# Patient Record
Sex: Male | Born: 1980 | Race: Black or African American | Hispanic: No | Marital: Single | Smoking: Never smoker
Health system: Southern US, Community
[De-identification: ages and names within clinical notes are randomized; demographics above are authoritative.]

## PROBLEM LIST (undated history)

## (undated) DIAGNOSIS — I10 Essential (primary) hypertension: Secondary | ICD-10-CM

---

## 2018-02-10 ENCOUNTER — Other Ambulatory Visit: Payer: Self-pay

## 2018-02-10 ENCOUNTER — Encounter: Payer: Self-pay | Admitting: Emergency Medicine

## 2018-02-10 ENCOUNTER — Emergency Department
Admission: EM | Admit: 2018-02-10 | Discharge: 2018-02-10 | Disposition: A | Discharge status: Home / Self Care | Payer: Self-pay | Attending: Student in an Organized Health Care Education/Training Program | Admitting: Student in an Organized Health Care Education/Training Program

## 2018-02-10 DIAGNOSIS — I10 Essential (primary) hypertension: Secondary | ICD-10-CM | POA: Insufficient documentation

## 2018-02-10 DIAGNOSIS — K0889 Other specified disorders of teeth and supporting structures: Secondary | ICD-10-CM | POA: Insufficient documentation

## 2018-02-10 HISTORY — DX: Essential (primary) hypertension: I10

## 2018-02-10 MED ORDER — IBUPROFEN 800 MG PO TABS: 800.0000 mg | ORAL_TABLET | Freq: Three times a day (TID) | ORAL | 0 refills | Status: AC | PRN

## 2018-02-10 MED ORDER — TRAMADOL HCL 50 MG PO TABS: 50.0000 mg | ORAL_TABLET | Freq: Four times a day (QID) | ORAL | 0 refills | Status: AC | PRN

## 2018-02-10 MED ORDER — AMOXICILLIN 500 MG PO CAPS: 500.0000 mg | ORAL_CAPSULE | Freq: Three times a day (TID) | ORAL | 0 refills | Status: AC

## 2018-02-10 MED ORDER — KETOROLAC TROMETHAMINE 30 MG/ML IJ SOLN
30.0000 mg | Freq: Once | INTRAMUSCULAR | Status: AC
  Administered 2018-02-10: 30 mg via INTRAMUSCULAR
  Filled 2018-02-10: qty 1

## 2018-02-10 NOTE — ED Notes (Signed)
See triage note  Presents with dental pain  Was recently placed on antibiotics but now having increased pain

## 2018-02-10 NOTE — ED Triage Notes (Signed)
Right uppper tooth needs to be pulled and has appt for that.  Dentist put him on antibiotics and he is almost done with them, but pain is increaseing.

## 2018-02-10 NOTE — ED Provider Notes (Signed)
[facility] Emergency Department Provider Note  ____________________________________________  Time seen: Approximately 3:45 PM  I have reviewed the triage vital signs and the nursing notes.   HISTORY  Chief Complaint Dental Pain and Otalgia    HPI Luis Reynolds is a 37 y.o. male presents to the emergency department with dental pain from superior four.  Patient reports that he has been under the care of a dentist.  Patient anticipated having superior 4 pulled tomorrow but oral surgeon had to reschedule.  Patient reports that pain has been unmanageable and he is having difficulty sleeping.  He currently rates his pain at 10 out of 10 in intensity.  Patient has a history of essential hypertension and reports that blood pressure has been on managed recently due to pain.  Past Medical History:  Diagnosis Date  . Hypertension     There are no active problems to display for this patient.   History reviewed. No pertinent surgical history.  Prior to Admission medications   Medication Sig Start Date End Date Taking? Authorizing Provider  amoxicillin (AMOXIL) 500 MG capsule Take 1 capsule (500 mg total) by mouth 3 (three) times daily for 10 days. 02/10/18 02/20/18  Orvil Feil, PA-C  ibuprofen (ADVIL,MOTRIN) 800 MG tablet Take 1 tablet (800 mg total) by mouth every 8 (eight) hours as needed for up to 5 days. 02/10/18 02/15/18  Orvil Feil, PA-C  traMADol (ULTRAM) 50 MG tablet Take 1 tablet (50 mg total) by mouth every 6 (six) hours as needed for up to 3 days. 02/10/18 02/13/18  Orvil Feil, PA-C    Allergies Patient has no known allergies.  No family history on file.  Social History Social History   Tobacco Use  . Smoking status: Never Smoker  . Smokeless tobacco: Never Used  Substance Use Topics  . Alcohol use: Never    Frequency: Never  . Drug use: Not on file     Review of Systems  Constitutional: No fever/chills Eyes: No visual changes. No  discharge ENT: Patient has dental pain.  Cardiovascular: no chest pain. Respiratory: no cough. No SOB. Gastrointestinal: No abdominal pain.  No nausea, no vomiting.  No diarrhea.  No constipation. Musculoskeletal: Negative for musculoskeletal pain. Skin: Negative for rash, abrasions, lacerations, ecchymosis. Neurological: Negative for headaches, focal weakness or numbness.   ____________________________________________   PHYSICAL EXAM:  VITAL SIGNS: ED Triage Vitals [02/10/18 1501]  Enc Vitals Group     BP (!) 201/103     Pulse Rate (!) 114     Resp 16     Temp 99.5 F (37.5 C)     Temp Source Oral     SpO2 97 %     Weight 255 lb (115.7 kg)     Height  (1.702 m)     Head Circumference      Peak Flow      Pain Score 10     Pain Loc      Pain Edu?      Excl. in GC?      Constitutional: Alert and oriented. Well appearing and in no acute distress. Eyes: Conjunctivae are normal. PERRL. EOMI. Head: Atraumatic. ENT:      Ears: TMs are pearly.       Nose: No congestion/rhinnorhea.      Mouth/Throat: Mucous membranes are moist.  No dental caries.  [facility] tenderness to palpation. Cardiovascular: Normal rate, regular  rhythm. Normal S1 and S2.  Good peripheral circulation. Respiratory: Normal respiratory effort without tachypnea or retractions. Lungs CTAB. Good air entry to the bases with no decreased or absent breath sounds.  Musculoskeletal: Full range of motion to all extremities. No gross deformities appreciated. Neurologic:  Normal speech and language. No gross focal neurologic deficits are appreciated.  Skin:  Skin is warm, dry and intact. No rash noted.  ____________________________________________   LABS (all labs ordered are listed, but only abnormal results are displayed)  Labs Reviewed - No data to  display ____________________________________________  EKG   ____________________________________________  RADIOLOGY   No results found.  ____________________________________________    PROCEDURES  Procedure(s) performed:    Procedures    Medications - No data to display   ____________________________________________   INITIAL IMPRESSION / ASSESSMENT AND PLAN / ED COURSE  Pertinent labs & imaging results that were available during my care of the patient were reviewed by me and considered in my medical decision making (see chart for details).  Review of the [place] CSRS was performed in accordance of the [facility] prior to dispensing any controlled drugs.     Assessment and plan Dental pain Patient presents to the emergency department with superior for pain.  Overall physical exam was reassuring.  Patient was discharged with amoxicillin, a short course of tramadol and ibuprofen 800s.  He received an injection of Toradol in the emergency department. Blood pressure was reassessed prior to discharge at 187/93.   ____________________________________________  FINAL CLINICAL IMPRESSION(S) / ED DIAGNOSES  Final diagnoses:  Pain, dental      NEW MEDICATIONS STARTED DURING THIS VISIT:  ED Discharge Orders        Ordered    amoxicillin (AMOXIL) 500 MG capsule  3 times daily     02/10/18 1542    traMADol (ULTRAM) 50 MG tablet  Every 6 hours PRN     02/10/18 1542    ibuprofen (ADVIL,MOTRIN) 800 MG tablet  Every 8 hours PRN     02/10/18 1542          This chart was dictated using voice recognition software/Dragon. Despite best efforts to proofread, errors can occur which can change the meaning. Any change was purely unintentional.    Orvil Feil, PA-C 02/10/18 1555    Willy Eddy, MD 02/10/18 1757

## 2018-02-10 NOTE — Discharge Instructions (Signed)
OPTIONS FOR DENTAL FOLLOW UP CARE ° °[place] Department of Health and Human Services - Local Safety Net Dental Clinics °http://www.ncdhhs.gov/dph/oralhealth/services/safetynetclinics.htm °  °[facility] (336-562-3123) ° °[facility] [place] ([phone]) ° °[facility] ([phone] ext [phone]) ° °[facility] ([phone]) ° °[facility] ([phone]) °This clinic caters to the indigent population and is on a lottery system. °Location: °[facility], [address], [place] °Clinic Hours: °Wednesdays from 6pm - 9pm, patients seen by a lottery system. °For dates, call or go to [email or web address].[facility].edu/[facility]/patients/Dental-[facility] °Services: °Cleanings, fillings and simple extractions. °Payment Options: °DENTAL WORK IS FREE OF CHARGE. Bring proof of income or support. °Best way to get seen: °Arrive at 5:15 pm - this is a lottery, NOT first come/first serve, so arriving earlier will not increase your chances of being seen. °  °  °[facility] °[phone] °Select option 1 for emergencies °  °Location: °[facility], [address], [place] °Clinic Hours: °No walk-ins accepted - call the day before to schedule an appointment. °Check in times are 9:30 am and 1:30 pm. °Services: °Simple extractions, temporary fillings, pulpectomy/pulp debridement, uncomplicated abscess drainage. °Payment Options: °PAYMENT IS DUE AT THE TIME OF SERVICE.  Fee is usually $100-200, additional surgical procedures (e.g. abscess drainage) may be extra. °Cash, checks, Visa/MasterCard accepted.  Can file Medicaid if patient is covered for dental - patient should call case worker to check. °No discount for [facility] patients. °Best way to get seen: °MUST call the day before and get onto the schedule. Can usually be seen the next 1-2 days. No walk-ins accepted. °  °  °[facility] °[phone] °   °Location: °[facility], [address], [place] °Clinic Hours: °M, W, Th, F 8am or 1:30pm, Tues 9a or 1:30 - first come/first served. °Services: °Simple extractions, temporary fillings, uncomplicated abscess drainage.  You do not need to be an [place] resident. °Payment Options: °PAYMENT IS DUE AT THE TIME OF SERVICE. °Dental insurance, otherwise sliding scale - bring proof of income or support. °Depending on income and treatment needed, cost is usually $50-200. °Best way to get seen: °Arrive early as it is first come/first served. °  °  °[facility] °[phone] °  °Location: °[address] °Clinic Hours: °Mon-Thu 8a-5p °Services: °Most basic dental services including extractions and fillings. °Payment Options: °PAYMENT IS DUE AT THE TIME OF SERVICE. °Sliding scale, up to 50% off - bring proof if income or support. °Medicaid with dental option accepted. °Best way to get seen: °Call to schedule an appointment, can usually be seen within 2 weeks OR they will try to see walk-ins - show up at 8a or 2p (you may have to wait). °  °  °[facility] °[phone] °[place] RESIDENTS ONLY °  °Location: °[facility], [address], [place], [place] [address] °Clinic Hours: By appointment only. °Monday - Thursday 8am-5pm, Friday 8am-12pm °Services: Cleanings, fillings, extractions. °Payment Options: °PAYMENT IS DUE AT THE TIME OF SERVICE. °Cash, Visa or MasterCard. Sliding scale - $30 minimum per service. °Best way to get seen: °Come in to office, complete packet and make an appointment - need proof of income °or support monies for each household member and proof of [place] residence. °Usually takes about a month to get in. °  °  °[facility] °[phone] °  °Location: °[address]   [place] °Clinic Hours: Walk-in Urgent Care Dental Services are offered Monday-Friday  mornings only. °The numbers of emergencies accepted daily is limited to the number of °providers available. °Maximum 15 - Mondays, Wednesdays & Thursdays °Maximum 10 - Tuesdays & Fridays °Services: °You do not need to be a [place] resident to be seen for a dental emergency. °Emergencies are defined as pain, swelling, abnormal bleeding, or dental trauma. Walkins will receive x-rays if needed. °NOTE: Dental cleaning is not an emergency. °Payment Options: °PAYMENT IS DUE AT THE TIME OF SERVICE. °Minimum co-pay is $40.00 for uninsured patients. °Minimum co-pay is $3.00 for Medicaid with dental coverage. °Dental Insurance is accepted and must be presented at time of visit. °Medicare does not cover dental. °Forms of payment: Cash, credit card, checks. °Best way to get seen: °If not previously registered with the clinic, walk-in dental registration begins at 7:15 am and is on a first come/first serve basis. °If previously registered with the clinic, call to make an appointment. °  °  °[facility] °[phone] °[place] RESIDENTS ONLY °  °Location: °[address], [place], [place] °Clinic Hours: °Mon-Thu 10a-2p °Services: Extractions only! °Payment Options: °FREE (donations accepted) - bring proof of income or support °Best way to get seen: °Call and schedule an appointment OR come at 8am on the 1st Monday of every month (except for holidays) when it is first come/first served. °  °  °[facility] °[phone] °  °Location: °[address], [place] °Clinic Hours: °Friday mornings °Services, Payment Options, Best way to get seen: °Call for info °

## 2018-04-03 ENCOUNTER — Encounter: Payer: Self-pay | Admitting: Emergency Medicine

## 2018-04-03 ENCOUNTER — Emergency Department
Admission: EM | Admit: 2018-04-03 | Discharge: 2018-04-03 | Disposition: A | Discharge status: Home / Self Care | Payer: Self-pay | Attending: Emergency Medicine | Admitting: Emergency Medicine

## 2018-04-03 DIAGNOSIS — M5441 Lumbago with sciatica, right side: Secondary | ICD-10-CM | POA: Insufficient documentation

## 2018-04-03 DIAGNOSIS — I10 Essential (primary) hypertension: Secondary | ICD-10-CM | POA: Insufficient documentation

## 2018-04-03 MED ORDER — METHOCARBAMOL 500 MG PO TABS: 500.0000 mg | ORAL_TABLET | Freq: Three times a day (TID) | ORAL | 0 refills | Status: AC | PRN

## 2018-04-03 MED ORDER — KETOROLAC TROMETHAMINE 10 MG PO TABS: 10.0000 mg | ORAL_TABLET | Freq: Four times a day (QID) | ORAL | 0 refills | Status: AC | PRN

## 2018-04-03 MED ORDER — KETOROLAC TROMETHAMINE 30 MG/ML IJ SOLN
30.0000 mg | Freq: Once | INTRAMUSCULAR | Status: AC
  Administered 2018-04-03: 30 mg via INTRAMUSCULAR
  Filled 2018-04-03: qty 1

## 2018-04-03 NOTE — ED Provider Notes (Signed)
[facility] Emergency Department Provider Note  ____________________________________________  Time seen: Approximately 8:56 PM  I have reviewed the triage vital signs and the nursing notes.   HISTORY  Chief Complaint Leg Pain    HPI Luis Reynolds is a 37 y.o. male   presents to the emergency department with low back pain with right lower extremity radiculopathy that started this morning.  Patient describes radiating pain as "like electricity and spasming".  Pain is worsened with ambulation and improved with rest.  Patient denies falls or mechanisms of trauma.  He has not taken any medications today for pain.  He denies bowel or bladder incontinence as well as saddle anesthesia.   Past Medical History:  Diagnosis Date  . Hypertension     There are no active problems to display for this patient.   History reviewed. No pertinent surgical history.  Prior to Admission medications   Medication Sig Start Date End Date Taking? Authorizing Provider  ketorolac (TORADOL) 10 MG tablet Take 1 tablet (10 mg total) by mouth every 6 (six) hours as needed for up to 5 days. 04/03/18 04/08/18  Orvil Feil, PA-C  methocarbamol (ROBAXIN) 500 MG tablet Take 1 tablet (500 mg total) by mouth every 8 (eight) hours as needed for up to 5 days for muscle spasms. 04/03/18 04/08/18  Orvil Feil, PA-C    Allergies Patient has no known allergies.  No family history on file.  Social History Social History   Tobacco Use  . Smoking status: Never Smoker  . Smokeless tobacco: Never Used  Substance Use Topics  . Alcohol use: Never    Frequency: Never  . Drug use: Not on file     Review of Systems  Constitutional: No fever/chills Eyes: No visual changes. No discharge ENT: No upper respiratory complaints. Cardiovascular: no chest pain. Respiratory: no cough. No SOB. Gastrointestinal: No abdominal pain.  No nausea, no vomiting.  No diarrhea.  No  constipation. Genitourinary: Negative for dysuria. No hematuria Musculoskeletal: Patient has low back pain. Skin: Negative for rash, abrasions, lacerations, ecchymosis. Neurological: Negative for headaches, focal weakness or numbness.   ____________________________________________   PHYSICAL EXAM:  VITAL SIGNS: ED Triage Vitals  Enc Vitals Group     BP 04/03/18 2011 (!) 144/99     Pulse Rate 04/03/18 2011 (!) 120     Resp 04/03/18 2011 18     Temp 04/03/18 2011 98.7 F (37.1 C)     Temp src --      SpO2 04/03/18 2011 95 %     Weight 04/03/18 2012 245 lb (111.1 kg)     Height 04/03/18 2012 5\' 7"  (1.702 m)     Head Circumference --      Peak Flow --      Pain Score 04/03/18 2011 8     Pain Loc --      Pain Edu? --      Excl. in GC? --      Constitutional: Alert and oriented. Well appearing and in no acute distress. Eyes: Conjunctivae are normal. PERRL. EOMI. Head: Atraumatic. Cardiovascular: Normal rate, regular rhythm. Normal S1 and S2.  Good peripheral circulation. Respiratory: Normal respiratory effort without tachypnea or retractions. Lungs CTAB. Good air entry to the bases with no decreased or absent breath sounds. Gastrointestinal: Bowel sounds 4 quadrants. Soft and nontender to palpation. No guarding or rigidity. No palpable masses. No distention. No CVA tenderness. Musculoskeletal: Full range of motion to all extremities. No gross deformities  appreciated.  Patient has paraspinal muscle tenderness elicited on physical exam and a positive straight leg raise, right. Neurologic:  Normal speech and language. No gross focal neurologic deficits are appreciated.  Skin:  Skin is warm, dry and intact. No rash noted. Psychiatric: Mood and affect are normal. Speech and behavior are normal. Patient exhibits appropriate insight and judgement.   ____________________________________________   LABS (all labs ordered are listed, but only abnormal results are displayed)  Labs  Reviewed - No data to display ____________________________________________  EKG   ____________________________________________  RADIOLOGY   No results found.  ____________________________________________    PROCEDURES  Procedure(s) performed:    Procedures    Medications  ketorolac (TORADOL) 30 MG/ML injection 30 mg (30 mg Intramuscular Given 04/03/18 2056)     ____________________________________________   INITIAL IMPRESSION / ASSESSMENT AND PLAN / ED COURSE  Pertinent labs & imaging results that were available during my care of the patient were reviewed by me and considered in my medical decision making (see chart for details).  Review of the [place] CSRS was performed in accordance of the [facility] prior to dispensing any controlled drugs.      Assessment and plan Low back pain with right lower extremity radiculopathy Patient presents to the emergency department with low back pain  with radiculopathy of the right lower extremity that started today.  Patient was given an injection of Toradol in the emergency department and discharged with Toradol by mouth.  He was also discharged with a short course of Robaxin.  He was advised to follow-up with primary care as needed.  All patient questions were answered.    ____________________________________________  FINAL CLINICAL IMPRESSION(S) / ED DIAGNOSES  Final diagnoses:  Acute right-sided low back pain with right-sided sciatica      NEW MEDICATIONS STARTED DURING THIS VISIT:  ED Discharge Orders        Ordered    ketorolac (TORADOL) 10 MG tablet  Every 6 hours PRN     04/03/18 2052    methocarbamol (ROBAXIN) 500 MG tablet  Every 8 hours PRN     04/03/18 2052          This chart was dictated using voice recognition software/Dragon. Despite best efforts to proofread, errors can occur which can change the meaning. Any change was purely unintentional.    Orvil FeilWoods, Marai Teehan M, PA-C 04/03/18 2059    Sharman CheekStafford,  Phillip, MD 04/03/18 2248

## 2018-04-03 NOTE — ED Triage Notes (Signed)
Patient with complaint of lower back pain radiating down to his right knee that started this morning. Patient denies any injury.

## 2018-04-25 ENCOUNTER — Emergency Department: Payer: Self-pay

## 2018-04-25 ENCOUNTER — Emergency Department
Admission: EM | Admit: 2018-04-25 | Discharge: 2018-04-25 | Disposition: A | Discharge status: Home / Self Care | Payer: Self-pay | Attending: Emergency Medicine | Admitting: Emergency Medicine

## 2018-04-25 ENCOUNTER — Encounter: Payer: Self-pay | Admitting: Emergency Medicine

## 2018-04-25 ENCOUNTER — Other Ambulatory Visit: Payer: Self-pay

## 2018-04-25 DIAGNOSIS — M25561 Pain in right knee: Secondary | ICD-10-CM | POA: Insufficient documentation

## 2018-04-25 DIAGNOSIS — R2241 Localized swelling, mass and lump, right lower limb: Secondary | ICD-10-CM | POA: Insufficient documentation

## 2018-04-25 DIAGNOSIS — I10 Essential (primary) hypertension: Secondary | ICD-10-CM | POA: Insufficient documentation

## 2018-04-25 DIAGNOSIS — R079 Chest pain, unspecified: Secondary | ICD-10-CM | POA: Insufficient documentation

## 2018-04-25 LAB — BASIC METABOLIC PANEL
ANION GAP: 7 (ref 5–15)
BUN: 12 mg/dL (ref 6–20)
CALCIUM: 9.2 mg/dL (ref 8.9–10.3)
CHLORIDE: 105 mmol/L (ref 98–111)
CO2: 28 mmol/L (ref 22–32)
Creatinine, Ser: 0.8 mg/dL (ref 0.61–1.24)
Glucose, Bld: 106 mg/dL — ABNORMAL HIGH (ref 70–99)
Potassium: 3.6 mmol/L (ref 3.5–5.1)
Sodium: 140 mmol/L (ref 135–145)

## 2018-04-25 LAB — CBC
HEMATOCRIT: 42.6 % (ref 40.0–52.0)
HEMOGLOBIN: 14.8 g/dL (ref 13.0–18.0)
MCH: 31.4 pg (ref 26.0–34.0)
MCHC: 34.7 g/dL (ref 32.0–36.0)
MCV: 90.7 fL (ref 80.0–100.0)
Platelets: 196 10*3/uL (ref 150–440)
RBC: 4.7 MIL/uL (ref 4.40–5.90)
RDW: 13.2 % (ref 11.5–14.5)
WBC: 6.8 10*3/uL (ref 3.8–10.6)

## 2018-04-25 LAB — TROPONIN I: Troponin I: <0.03 ng/mL (ref <0.03)

## 2018-04-25 LAB — FIBRIN DERIVATIVES D-DIMER (ARMC ONLY): FIBRIN DERIVATIVES D-DIMER (ARMC): 330.32 ng{FEU}/mL (ref 0.00–499.00)

## 2018-04-25 MED ORDER — ACETAMINOPHEN 500 MG PO TABS
1000.0000 mg | ORAL_TABLET | ORAL | Status: AC
  Administered 2018-04-25: 1000 mg via ORAL
  Filled 2018-04-25: qty 2

## 2018-04-25 MED ORDER — ASPIRIN 81 MG PO CHEW
324.0000 mg | CHEWABLE_TABLET | Freq: Once | ORAL | Status: AC
  Administered 2018-04-25: 324 mg via ORAL
  Filled 2018-04-25: qty 4

## 2018-04-25 NOTE — ED Triage Notes (Signed)
Pt to ed with c/o right leg pain that starts in knee and around the back of the leg x 1 month.  Pt also states he had central chest pain this am intermittent, denies sob, denies dizziness, denies weakness, denies n/v, denies back pain, denies diaphoresis.

## 2018-04-25 NOTE — ED Notes (Signed)
Patient transported to Ultrasound 

## 2018-04-25 NOTE — ED Notes (Signed)
First Nurse Note: Patient taken to Triage 3, EKG done and reviewed by Dr. Roxan Hockeyobinson.  Skin warm and dry, color good.

## 2018-04-25 NOTE — ED Notes (Signed)
Pt c/o right leg pain for the past month off and on - last night the pain got worse and has increased in intensity - pain is worse with walking and standing Pt c/o chest pain that started this am - the pain is on the right side and radiates to the middle - the pain is a sharp pain that comes and goes

## 2018-04-25 NOTE — ED Provider Notes (Signed)
[facility] Emergency Department Provider Note   ____________________________________________   First MD Initiated Contact with Patient 04/25/18 1030     (approximate)  I have reviewed the triage vital signs and the nursing notes.   HISTORY  Chief Complaint Chest Pain and Leg Pain    HPI Luis Reynolds is a 37 y.o. male expressing pain behind his right knee for about a month.  Been told is likely muscular or could have injured something inside the joint, has not been able to follow-up with orthopedic.  Reports he still able to walk on it, causes achy pain mostly when he standing or bending the right knee.  Started about a month ago when he felt something "pop" when he was getting up out of bed.  Describes a moderate achy right knee discomfort.  No swelling in the leg.  Also had a brief episode where he felt achiness in his chest this morning when the knee was hurting.  This is now resolved after taking Toradol.  Reports it was a slight sharp feeling in the middle of his chest but is now gone away.  No shortness of breath.  Able to exert himself without difficulty except for discomfort in the right knee.  No personal history of heart disease but does have a history of "high blood pressure".  Takes no medication of than occasional Toradol or ibuprofen   Past Medical History:  Diagnosis Date  . Hypertension     There are no active problems to display for this patient.   History reviewed. No pertinent surgical history.  Prior to Admission medications   Not on File    Allergies Patient has no known allergies.  History reviewed. No pertinent family history.  Social History Social History   Tobacco Use  . Smoking status: Never Smoker  . Smokeless tobacco: Never Used  Substance Use Topics  . Alcohol use: Never    Frequency: Never  . Drug use: Never    Review of Systems Constitutional: No fever/chills Eyes: No visual changes. ENT: No sore  throat. Cardiovascular: See HPI. Respiratory: Denies shortness of breath. Gastrointestinal: No abdominal pain.  No nausea, no vomiting.  No diarrhea.  No constipation. Genitourinary: Negative for dysuria. Musculoskeletal: Negative for back pain.  Denies any discomfort or problems involving the left leg or arms. Skin: Negative for rash. Neurological: Negative for headaches, focal weakness or numbness.  No weakness in the right foot.  No numbness or tingling.    ____________________________________________   PHYSICAL EXAM:  VITAL SIGNS: ED Triage Vitals  Enc Vitals Group     BP 04/25/18 1019 (!) 159/82     Pulse Rate 04/25/18 1019 (!) 108     Resp 04/25/18 1019 16     Temp 04/25/18 1019 98.7 F (37.1 C)     Temp Source 04/25/18 1019 Oral     SpO2 04/25/18 1019 100 %     Weight 04/25/18 1020 245 lb (111.1 kg)     Height 04/25/18 1020 5\' 7"  (1.702 m)     Head Circumference --      Peak Flow --      Pain Score 04/25/18 1019 10     Pain Loc --      Pain Edu? --      Excl. in GC? --     Constitutional: Alert and oriented. Well appearing and in no acute distress.  Very pleasant. Eyes: Conjunctivae are normal. Head: Atraumatic. Nose: No congestion/rhinnorhea. Mouth/Throat: Mucous membranes are  moist. Neck: No stridor.   Cardiovascular: Normal rate, regular rhythm. Grossly normal heart sounds.  Good peripheral circulation. Respiratory: Normal respiratory effort.  No retractions. Lungs CTAB. Gastrointestinal: Soft and nontender. No distention. Musculoskeletal:   Lower Extremities  No edema. Normal DP/PT pulses bilateral with good cap refill.  Normal neuro-motor function lower extremities bilateral.  RIGHT Right lower extremity demonstrates normal strength, good use of all muscles. No edema bruising or contusions of the right hip, right knee, right ankle.  There is some mild tenderness across the anterior and posterior portions of the right knee joint without notable  effusion or swelling.  Reports tenderness is somewhat worse along the posterior surface of the knee joint.  Full range of motion of the right lower extremity with some pain in the right knee she reports radiates posteriorly with bending it. No pain on axial loading.  Lockman and posterior drawer testing do not reveal any obvious laxity.  No evidence of trauma.  No swelling.  No venous cords or congestion.  LEFT Left lower extremity demonstrates normal strength, good use of all muscles. No edema bruising or contusions of the hip,  knee, ankle. Full range of motion of the left lower extremity without pain. No pain on axial loading. No evidence of trauma.   Neurologic:  Normal speech and language. No gross focal neurologic deficits are appreciated.  Skin:  Skin is warm, dry and intact. No rash noted. Psychiatric: Mood and affect are normal. Speech and behavior are normal.  ____________________________________________   LABS (all labs ordered are listed, but only abnormal results are displayed)  Labs Reviewed  BASIC METABOLIC PANEL - Abnormal; Notable for the following components:      Result Value   Glucose, Bld 106 (*)    All other components within normal limits  CBC  FIBRIN DERIVATIVES D-DIMER ([facility] ONLY)  TROPONIN I   ____________________________________________  EKG  EKG reviewed and interpreted by me at 1005 Ventricular rate 110 QRS 100 QTc 440 Sinus tachycardia, possible LVH, also probable early repolarization abnormality.  There is some slight ST abnormality denoted primarily across V2 and V3, waveform appearance does not appear consistent with acute ST elevation MI ____________________________________________  RADIOLOGY  Dg Chest 2 View  Result Date: 04/25/2018 CLINICAL DATA:  Right leg pain on and off for a month.  Chest pain. EXAM: CHEST - 2 VIEW COMPARISON:  None. FINDINGS: The heart, hila, mediastinum, lungs, and pleura are unremarkable. IMPRESSION: No active  cardiopulmonary disease. Electronically Signed   By: Gerome Samavid  Williams III M.D   On: 04/25/2018 11:04   [place] Venous Img Lower Unilateral Right  Result Date: 04/25/2018 CLINICAL DATA:  Pain behind the right knee past month. Evaluate for DVT. EXAM: RIGHT LOWER EXTREMITY VENOUS DOPPLER ULTRASOUND TECHNIQUE: Gray-scale sonography with graded compression, as well as color Doppler and duplex ultrasound were performed to evaluate the lower extremity deep venous systems from the level of the common femoral vein and including the common femoral, femoral, profunda femoral, popliteal and calf veins including the posterior tibial, peroneal and gastrocnemius veins when visible. The superficial great saphenous vein was also interrogated. Spectral Doppler was utilized to evaluate flow at rest and with distal augmentation maneuvers in the common femoral, femoral and popliteal veins. COMPARISON:  None. FINDINGS: Contralateral Common Femoral Vein: Respiratory phasicity is normal and symmetric with the symptomatic side. No evidence of thrombus. Normal compressibility. Common Femoral Vein: No evidence of thrombus. Normal compressibility, respiratory phasicity and response to augmentation. Saphenofemoral Junction: No evidence of  thrombus. Normal compressibility and flow on color Doppler imaging. Profunda Femoral Vein: No evidence of thrombus. Normal compressibility and flow on color Doppler imaging. Femoral Vein: No evidence of thrombus. Normal compressibility, respiratory phasicity and response to augmentation. Popliteal Vein: No evidence of thrombus. Normal compressibility, respiratory phasicity and response to augmentation. Calf Veins: No evidence of thrombus. Normal compressibility and flow on color Doppler imaging. Superficial Great Saphenous Vein: No evidence of thrombus. Normal compressibility. Venous Reflux:  None. Other Findings: Note is made of a prominent though non pathologically enlarged right inguinal lymph node which  measures approximately 1.2 cm in diameter and maintains a benign fatty hilum There is a mixed echogenic ill-defined approximately 2.6 x 1.2 x 2.2 cm structure most remote new which correlates with the patient's palpable area of concern. There is no definitive blood flow demonstrated within this structure. IMPRESSION: 1. No evidence of DVT within right lower extremity. 2. An approximately 2.6 cm structure appears to correlate with the patient's palpable area of concern posterior to the right knee - as this structure remains indeterminate on this examination, further evaluation with MRI could be performed as clinically indicated. Electronically Signed   By: Simonne Come M.D.   On: 04/25/2018 12:14    Ultrasound imaging reviewed by me  ____________________________________________   PROCEDURES  Procedure(s) performed: None  Procedures  Critical Care performed: No  ____________________________________________   INITIAL IMPRESSION / ASSESSMENT AND PLAN / ED COURSE  Pertinent labs & imaging results that were available during my care of the patient were reviewed by me and considered in my medical decision making (see chart for details).  Patient reports primarily here because of ongoing discomfort behind the right knee joint.,  He reports is been going on for a month now and he was seen here and also was seen and had an x-ray done at [facility] roughly 3 weeks ago and reports that he was told it was reassuring.  On exam his knee joint is just slightly tender without erythema or evidence of infection.  He does have tenderness behind the right knee as well which is my suspicion for a possible cyst or potential DVT though no provoking factor is noted.  Additionally the patient had a brief episode where he reports he felt a little bit of discomfort or slight aching feeling in his chest when his right leg was hurting earlier, after taking a Toradol he reports that his pain in the chest went away in the  right knee is been feeling better.  We will send d-dimer for exclusion of PE, low risk by clinical examination without evidence of leg swelling venous cords or congestion.  In addition ultrasound right lower extremity to evaluate for DVT or cyst.  We will send CBC BMP and troponin, very unlikely with very atypical symptoms of coronary syndrome feel very low risk for ACS.    Low risk ACS by HEART score.  Low risk PE by clincal exam and Well's score, send d-dimer.  Patient reports no history of any malignancy.  No cough or hemoptysis.  No recent long trips or travel.  No recent hospitalizations.  No history of any blood clots.  Return precautions and treatment recommendations and follow-up discussed with the patient who is agreeable with the plan.   Offered patient MRI for further evaluation of the knee to evaluate for abnormal structures, mass or tumor or cause of pain, patient has to pick up his son at 2 PM.  He is agreeable to following up  closely with orthopedics for further evaluation of the knee, and he will call for appointments this afternoon.  ____________________________________________   FINAL CLINICAL IMPRESSION(S) / ED DIAGNOSES  Final diagnoses:  Acute pain of right knee      NEW MEDICATIONS STARTED DURING THIS VISIT:  New Prescriptions   No medications on file     Note:  This document was prepared using Dragon voice recognition software and may include unintentional dictation errors.     Sharyn Creamer, MD 04/25/18 1256

## 2018-04-25 NOTE — Discharge Instructions (Addendum)
Is we discussed your ultrasound demonstrated a abnormal "structure" behind your right knee, and we recommend you have a follow-up MRI.  Please call 1 of our orthopedic physicians to set up a close follow-up, have them evaluate your knee and consider obtaining an MRI or further evaluation.  Come back if you have increased pain, can't walk, have a blue or cold foot, develop redness or more swelling around the leg or knee, or other concerns arise.  Return to the Emergency Department (ED) if you experience any further chest pain/pressure/tightness, difficulty breathing, or sudden sweating, or other symptoms that concern you.

## 2018-08-29 ENCOUNTER — Other Ambulatory Visit: Payer: Self-pay

## 2018-08-29 ENCOUNTER — Emergency Department
Admission: EM | Admit: 2018-08-29 | Discharge: 2018-08-29 | Disposition: A | Discharge status: Home / Self Care | Payer: Managed Care, Other (non HMO) | Attending: Emergency Medicine | Admitting: Emergency Medicine

## 2018-08-29 ENCOUNTER — Emergency Department: Payer: Managed Care, Other (non HMO)

## 2018-08-29 ENCOUNTER — Encounter: Payer: Self-pay | Admitting: Emergency Medicine

## 2018-08-29 DIAGNOSIS — R42 Dizziness and giddiness: Secondary | ICD-10-CM | POA: Diagnosis not present

## 2018-08-29 DIAGNOSIS — R51 Headache: Secondary | ICD-10-CM | POA: Diagnosis present

## 2018-08-29 DIAGNOSIS — I10 Essential (primary) hypertension: Secondary | ICD-10-CM | POA: Insufficient documentation

## 2018-08-29 DIAGNOSIS — R110 Nausea: Secondary | ICD-10-CM | POA: Insufficient documentation

## 2018-08-29 LAB — BASIC METABOLIC PANEL
Anion gap: 9 (ref 5–15)
BUN: 10 mg/dL (ref 6–20)
CALCIUM: 9.2 mg/dL (ref 8.9–10.3)
CHLORIDE: 107 mmol/L (ref 98–111)
CO2: 25 mmol/L (ref 22–32)
CREATININE: 0.69 mg/dL (ref 0.61–1.24)
GFR calc Af Amer: >60 mL/min (ref >60)
GFR calc non Af Amer: >60 mL/min (ref >60)
GLUCOSE: 102 mg/dL — AB (ref 70–99)
Potassium: 3.3 mmol/L — ABNORMAL LOW (ref 3.5–5.1)
Sodium: 141 mmol/L (ref 135–145)

## 2018-08-29 LAB — URINALYSIS, COMPLETE (UACMP) WITH MICROSCOPIC
BILIRUBIN URINE: NEGATIVE
Glucose, UA: NEGATIVE mg/dL
Hgb urine dipstick: NEGATIVE
KETONES UR: NEGATIVE mg/dL
LEUKOCYTES UA: NEGATIVE
Nitrite: NEGATIVE
PH: 5 (ref 5.0–8.0)
Protein, ur: NEGATIVE mg/dL
Specific Gravity, Urine: 1.008 (ref 1.005–1.030)

## 2018-08-29 LAB — CBC
HEMATOCRIT: 44.9 % (ref 39.0–52.0)
Hemoglobin: 15.1 g/dL (ref 13.0–17.0)
MCH: 30.6 pg (ref 26.0–34.0)
MCHC: 33.6 g/dL (ref 30.0–36.0)
MCV: 90.9 fL (ref 80.0–100.0)
Platelets: 226 10*3/uL (ref 150–400)
RBC: 4.94 MIL/uL (ref 4.22–5.81)
RDW: 13.2 % (ref 11.5–15.5)
WBC: 11.3 10*3/uL — ABNORMAL HIGH (ref 4.0–10.5)
nRBC: 0 % (ref 0.0–0.2)

## 2018-08-29 MED ORDER — ONDANSETRON HCL 4 MG/2ML IJ SOLN
4.0000 mg | Freq: Once | INTRAMUSCULAR | Status: AC | PRN
  Administered 2018-08-29: 4 mg via INTRAVENOUS
  Filled 2018-08-29: qty 2

## 2018-08-29 MED ORDER — HYDROCHLOROTHIAZIDE 12.5 MG PO CAPS: 12.5000 mg | ORAL_CAPSULE | Freq: Every day | ORAL | 1 refills | Status: AC

## 2018-08-29 MED ORDER — CLONIDINE HCL 0.1 MG PO TABS
0.1000 mg | ORAL_TABLET | Freq: Once | ORAL | Status: AC
  Administered 2018-08-29: 0.1 mg via ORAL
  Filled 2018-08-29: qty 1

## 2018-08-29 NOTE — ED Triage Notes (Signed)
Here for headache that started earlier today.  Pt c/o feeling dizzy and has had vomiting. Not sure if lights make it worse. Feels like going to pass out.  Reports vomiting because feels dizzy.  Dizziness worse when moves. Ambulated to triage with steady gait.  VSS.  Unlabored. Color WNL.  Pain across frontal area.

## 2018-08-29 NOTE — ED Notes (Addendum)
Pt ambulating in hallway with no difficulty or distress noted; pt directed back to subwait

## 2018-08-29 NOTE — Discharge Instructions (Signed)
Please follow-up with the primary care provider of your choice.  I recommend that you purchase a blood pressure machine to be used at home.  If you are unable to do so please have your blood pressure checked at the pharmacy.  Keep a record of your blood pressures to show your new primary care provider.  If you have any symptom of concern and you are unable to see your primary care provider, please return to the emergency department.

## 2018-08-29 NOTE — ED Provider Notes (Signed)
[facility] Emergency Department Provider Note ____________________________________________  Time seen: Approximately 10:20 PM  I have reviewed the triage vital signs and the nursing notes.   HISTORY  Chief Complaint Headache   HPI Luis Reynolds is a 37 y.o. male who presents to the emergency department for treatment and evaluation of headache.  Patient states that while at work today, he developed a headache with some nausea.  Patient states he has never had this happen before.  He is also complaining of dizziness.  Patient states that he has previously been told that he has hypertension, but has not taken any anti-hypertensives and does not have a primary care provider due to lack of insurance.  He took 400 of ibuprofen with some relief, but decided to come to the emergency department since it did completely take it away.  He is continued to feel nauseated and dizzy in addition to the headache all evening.  He states this is the worst headache is ever had.   Location: Frontal Similar to previous headaches: No Duration: Constant TIMING: This afternoon SEVERITY: 9/10 QUALITY: Throbbing CONTEXT: Possibly in the setting of hypertension MODIFYING FACTORS: Ibuprofen 400 mg ASSOCIATED SYMPTOMS: Dizziness and nausea Past Medical History:  Diagnosis Date  . Hypertension     There are no active problems to display for this patient.   History reviewed. No pertinent surgical history.  Prior to Admission medications   Medication Sig Start Date End Date Taking? Authorizing Provider  hydrochlorothiazide (MICROZIDE) 12.5 MG capsule Take 1 capsule (12.5 mg total) by mouth daily. 08/29/18   Chinita Pester, FNP    Allergies Patient has no known allergies.  History reviewed. No pertinent family history.  Social History Social History   Tobacco Use  . Smoking status: Never Smoker  . Smokeless tobacco: Never Used  Substance Use Topics  . Alcohol use: Never     Frequency: Never  . Drug use: Never    Review of Systems Constitutional: No fever/chills or recent injury. Eyes: No visual changes. ENT: No sore throat. Respiratory: Denies shortness of breath. Gastrointestinal: No abdominal pain.  No nausea, no vomiting.  No diarrhea.  No constipation. Musculoskeletal: Negative for pain. Skin: Negative for rash. Neurological:Positive for headache, negative for focal weakness or numbness. No confusion or fainting. ___________________________________________   PHYSICAL EXAM:  VITAL SIGNS: ED Triage Vitals [08/29/18 2031]  Enc Vitals Group     BP (!) 188/85     Pulse Rate (!) 101     Resp 20     Temp 98.6 F (37 C)     Temp Source Oral     SpO2 96 %     Weight 255 lb (115.7 kg)     Height 5\' 7"  (1.702 m)     Head Circumference      Peak Flow      Pain Score 9     Pain Loc      Pain Edu?      Excl. in GC?     Constitutional: Alert and oriented. Well appearing and in no acute distress. Eyes: Conjunctivae are normal. PERRL. EOMI without expressed pain. No evidence of papilledema on limited exam. Head: Atraumatic. Nose: No congestion/rhinnorhea. Mouth/Throat: Mucous membranes are moist.  Oropharynx non-erythematous. Neck: No stridor. Supple, no meningismus.  Cardiovascular: Normal rate, regular rhythm. Grossly normal heart sounds.  Good peripheral circulation. Respiratory: Normal respiratory effort.  No retractions. Lungs CTAB. Gastrointestinal: Soft and nontender. No distention.  Musculoskeletal: No lower extremity tenderness nor  edema.  No joint effusions. Neurologic:  Normal speech and language. No gross focal neurologic deficits are appreciated. No gait instability. Cranial nerves: 2-10 normal as tested. Cerebellar:Normal Romberg, finger-nose-finger, heel to shin, normal gait. Sensorimotor: No aphasia, pronator drift, clonus, sensory loss or abnormal reflexes.  Skin:  Skin is warm, dry and intact. No rash noted. Psychiatric: Mood and  affect are normal. Speech and behavior are normal. Normal thought process and cognition.  ____________________________________________   LABS (all labs ordered are listed, but only abnormal results are displayed)  Labs Reviewed  BASIC METABOLIC PANEL - Abnormal; Notable for the following components:      Result Value   Potassium 3.3 (*)    Glucose, Bld 102 (*)    All other components within normal limits  CBC - Abnormal; Notable for the following components:   WBC 11.3 (*)    All other components within normal limits  URINALYSIS, COMPLETE (UACMP) WITH MICROSCOPIC - Abnormal; Notable for the following components:   Color, Urine STRAW (*)    APPearance CLEAR (*)    All other components within normal limits  CBG MONITORING, ED   ____________________________________________  EKG  ED ECG REPORT I, Raahi Korber, FNP-BC, personally viewed and interpreted this ECG.   Date: 08/29/2018  EKG Time: 8:37 PM  Rate: 99  Rhythm: normal sinus rhythm  Axis: Normal axis  Intervals: Incomplete right bundle branch block  ST&T Change: No ST elevation  ____________________________________________  RADIOLOGY  Ct Head Wo Contrast  Result Date: 08/29/2018 CLINICAL DATA:  Headache beginning earlier today EXAM: CT HEAD WITHOUT CONTRAST TECHNIQUE: Contiguous axial images were obtained from the base of the skull through the vertex without intravenous contrast. COMPARISON:  None. FINDINGS: BRAIN: The ventricles and sulci are normal. No intraparenchymal hemorrhage, mass effect nor midline shift. No acute large vascular territory infarcts. Grey-white matter distinction is maintained. The basal ganglia are unremarkable. No abnormal extra-axial fluid collections. Basal cisterns are not effaced and midline. The brainstem and cerebellar hemispheres are without acute abnormalities. VASCULAR: Unremarkable. SKULL/SOFT TISSUES: No skull fracture. No significant soft tissue swelling. ORBITS/SINUSES: The included  ocular globes and orbital contents are normal.The mastoid air cells are clear. The included paranasal sinuses are well-aerated. OTHER: None. IMPRESSION: Normal head CT Electronically Signed   By: Tollie Eth M.D.   On: 08/29/2018 22:40   ____________________________________________   PROCEDURES  Procedure(s) performed:  Procedures  Critical Care performed: No ____________________________________________   INITIAL IMPRESSION / ASSESSMENT AND PLAN / ED COURSE  37 year old male presenting to the emergency department for treatment and evaluation of headache.  He is aware that he has had some hypertension, but has not been able to afford primary care provider.  Today, he has had headache.  Labs are fairly reassuring with the only abnormal findings is a potassium of 3.3 and a white blood cell count of 11.3.  Patient denies chest pain, shortness of breath, arm or facial numbness.  He has no cardiac history.  No family members have had a cardiac events at a young age.  Plan will be to do a CT of his head without contrast and to give him a clonidine 0.1 mg in hopes to gently lower the blood pressure.  Will reevaluate his headache after medications have had time to work.  ----------------------------------------- 11:34 PM on 08/29/2018 -----------------------------------------  Clonidine has gently brought the blood pressure down and the patient's headache has nearly resolved.  He will be prescribed hydrochlorothiazide 12.5 mg to be taken every day.  The patient verbalizes his intent to call tomorrow to schedule an appointment with primary care.  He states that his insurance will kick in January 7 and he has every intention of having an appointment scheduled by that time.  Patient was given strict ER return precautions including chest pain, shortness of breath, recurrence of headache with hypertension.  He was advised that he should purchase a blood pressure machine and keep a log of his readings.    Pertinent labs & imaging results that were available during my care of the patient were reviewed by me and considered in my medical decision making (see chart for details). ____________________________________________   FINAL CLINICAL IMPRESSION(S) / ED DIAGNOSES  Final diagnoses:  Hypertension, unspecified type    ED Discharge Orders         Ordered    hydrochlorothiazide (MICROZIDE) 12.5 MG capsule  Daily     08/29/18 2327            Chinita Pesterriplett, Annella Prowell B, FNP 08/29/18 2341    Arnaldo NatalMalinda, Paul F, MD 08/30/18 [phone]

## 2018-08-29 NOTE — ED Notes (Signed)
Pt stated that he has had headache since earlier today. Pt stated he has HTN put is currently not taking any medication for it. Denies any N/V or feeling SOB or chest pain at this time.

## 2018-08-29 NOTE — ED Notes (Signed)
Pt is going to medical imaging.   

## 2018-12-17 IMAGING — US US EXTREM LOW VENOUS*R*
2 series · 13 of 24 positions shown · non-contrast
Comparison: None.

CLINICAL DATA: Pain behind the right knee past month. Evaluate for
DVT.



[Series 1: us extrem low venous*right* · 12 of 48 slices shown (1 of 2)]
[im 1/48]
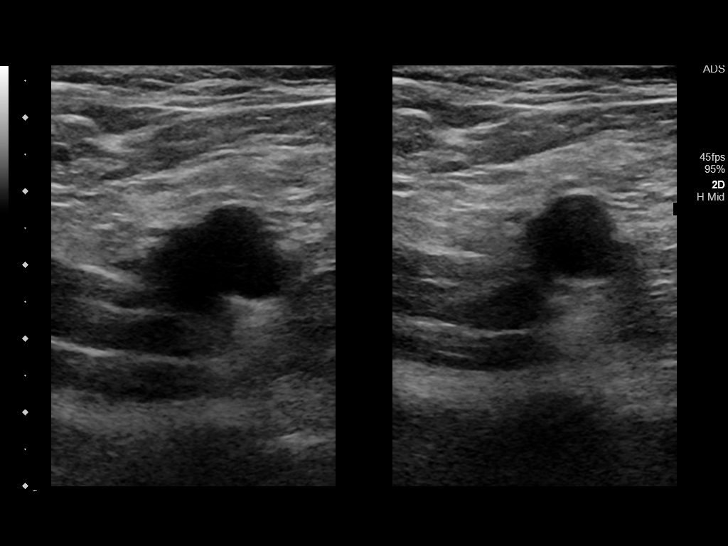
[im 5/48]
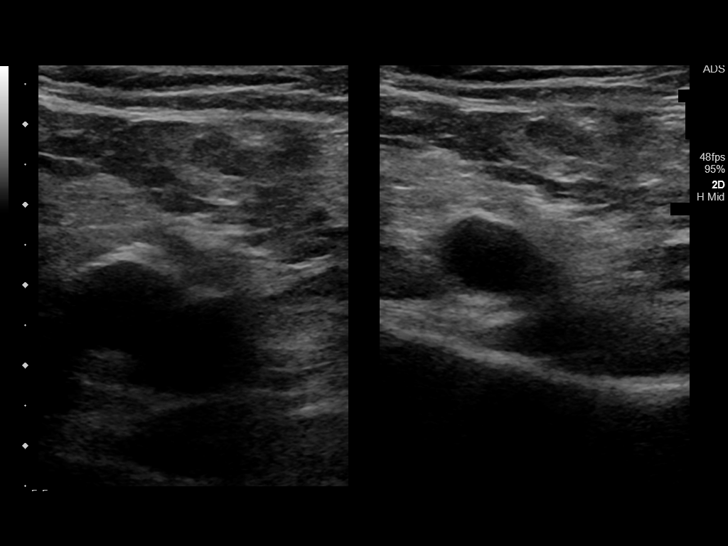
[im 9/48]
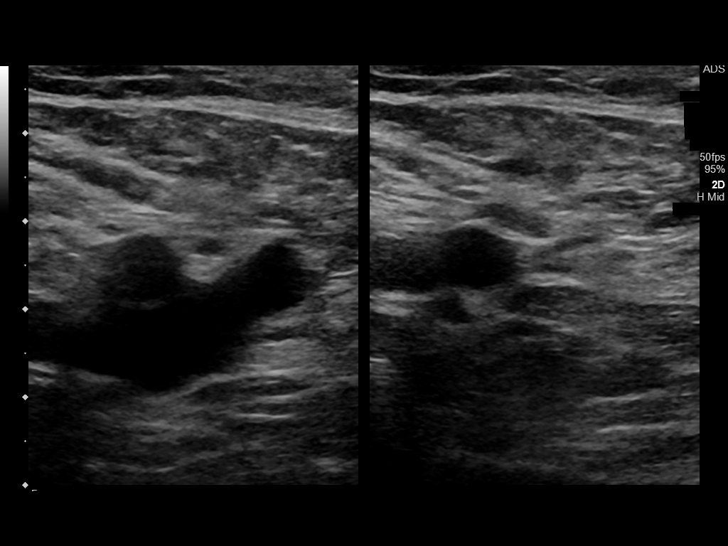
[im 13/48]
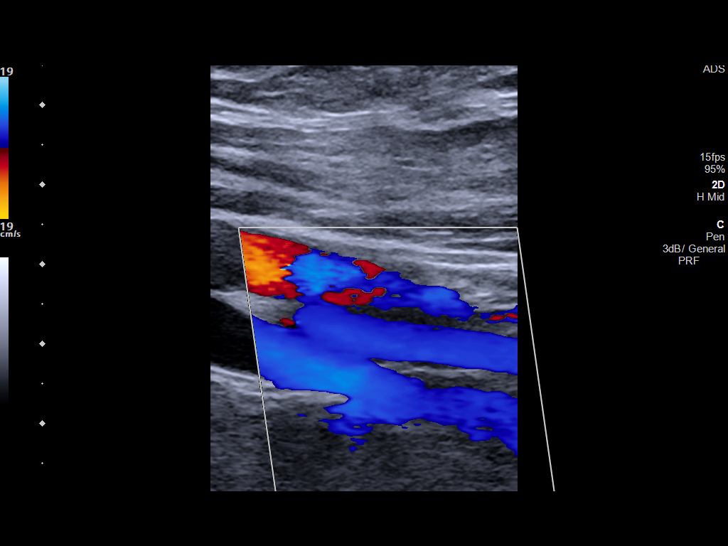
[im 18/48]
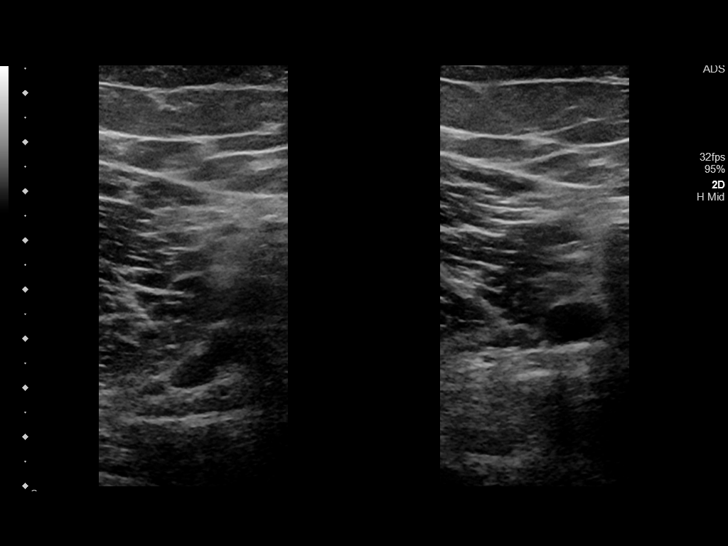
[im 22/48]
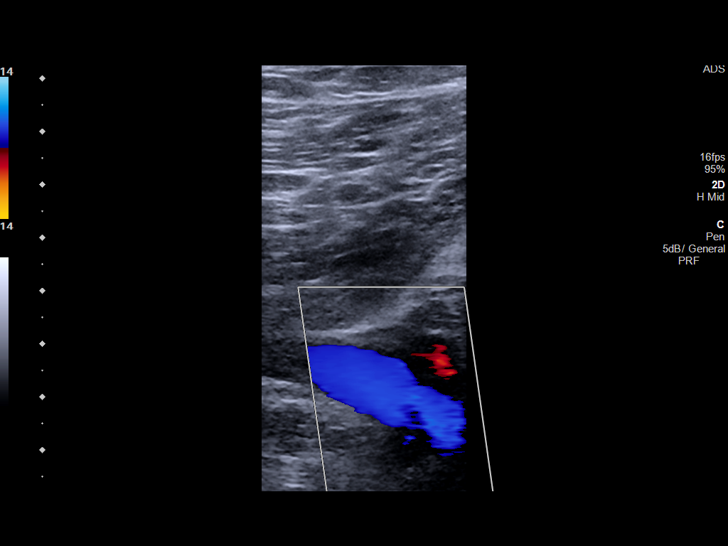
[im 28/48]
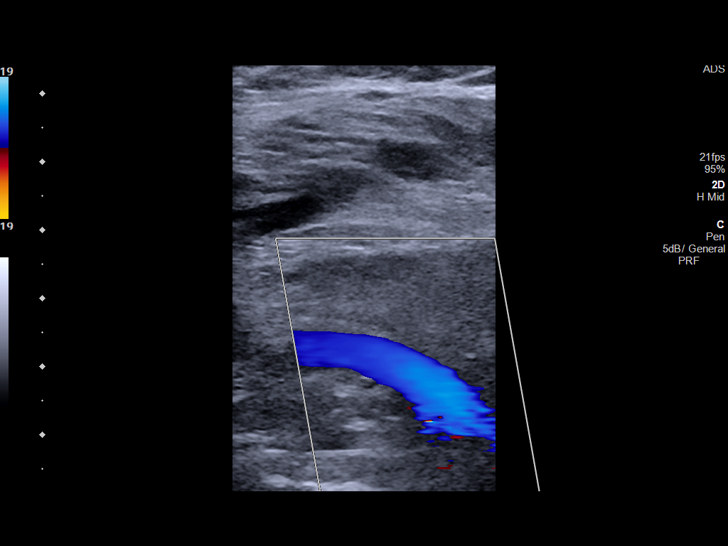
[im 30/48]
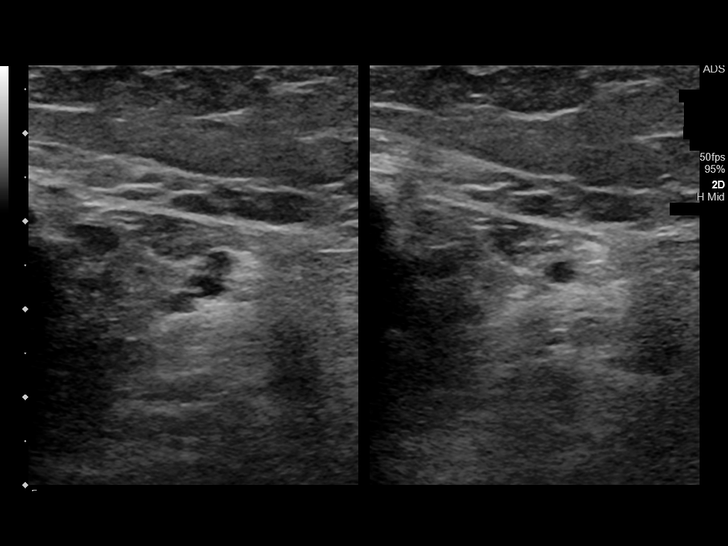
[im 35/48]
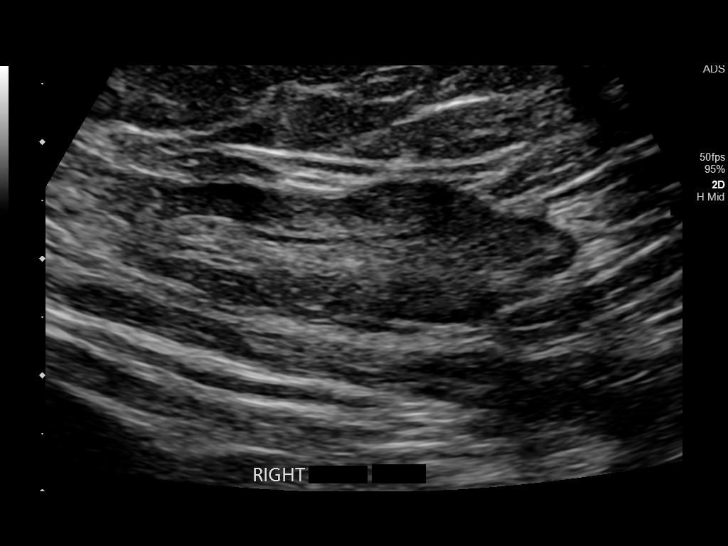
[im 39/48]
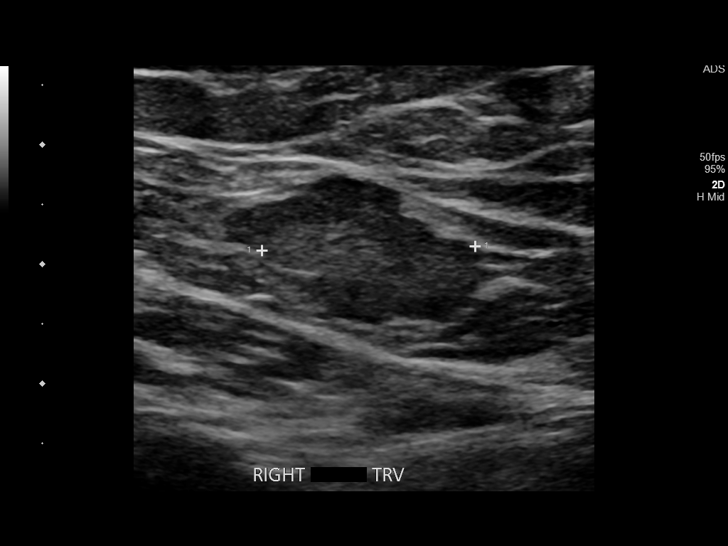
[im 43/48]
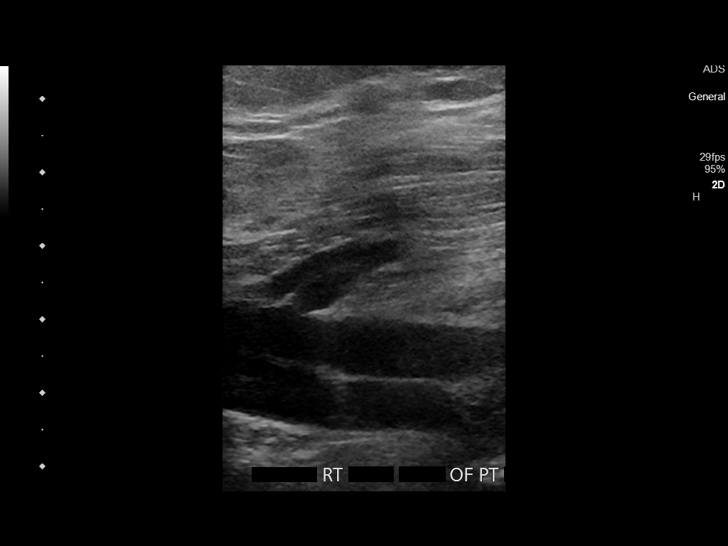
[im 48/48]
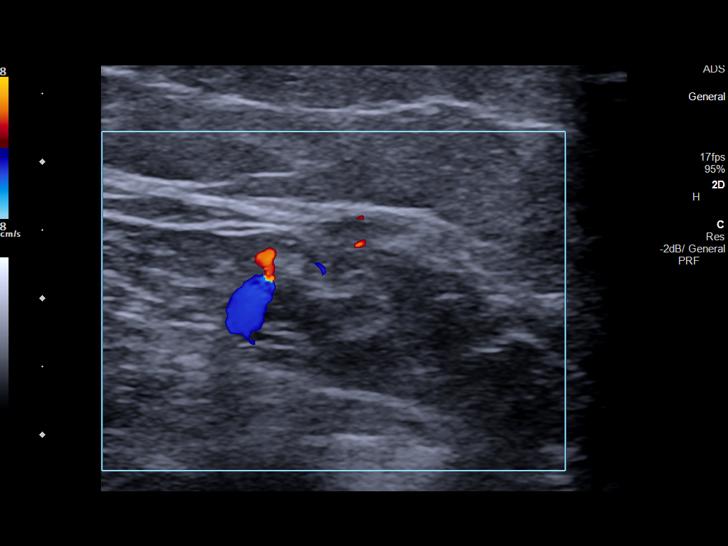

[Series 2: us extrem low venous*right* · 3 acquisitions, 1 frame shown (2 of 2)]
[im 1/3]
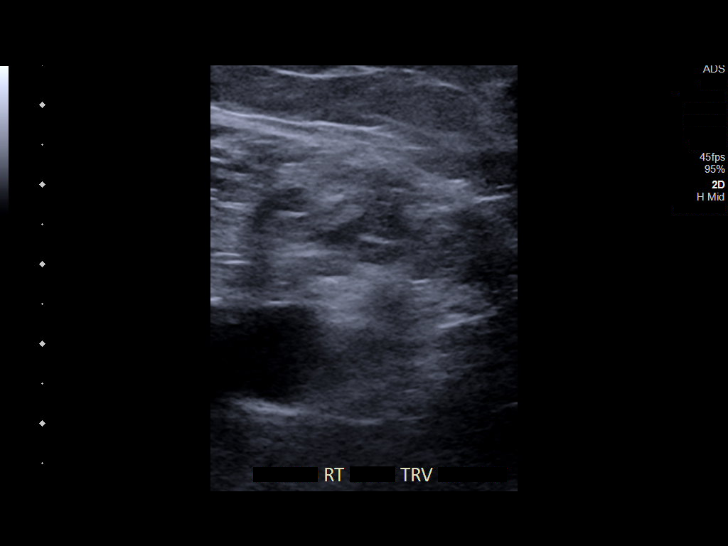

[13 of 24 positions shown; findings below may reference images not displayed]

FINDINGS: Contralateral Common Femoral Vein: Respiratory phasicity is normal
and symmetric with the symptomatic side. No evidence of thrombus.
Normal compressibility.

Common Femoral Vein: No evidence of thrombus. Normal
compressibility, respiratory phasicity and response to augmentation.

Saphenofemoral Junction: No evidence of thrombus. Normal
compressibility and flow on color Doppler imaging.

Profunda Femoral Vein: No evidence of thrombus. Normal
compressibility and flow on color Doppler imaging.

Femoral Vein: No evidence of thrombus. Normal compressibility,
respiratory phasicity and response to augmentation.

Popliteal Vein: No evidence of thrombus. Normal compressibility,
respiratory phasicity and response to augmentation.

Calf Veins: No evidence of thrombus. Normal compressibility and flow
on color Doppler imaging.

Superficial Great Saphenous Vein: No evidence of thrombus. Normal
compressibility.

Venous Reflux:  None.

Other Findings: Note is made of a prominent though non
pathologically enlarged right inguinal lymph node which measures
approximately 1.2 cm in diameter and maintains a benign fatty hilum

There is a mixed echogenic ill-defined approximately 2.6 x 1.2 x
cm structure most remote new which correlates with the patient's
palpable area of concern. There is no definitive blood flow
demonstrated within this structure.
IMPRESSION: 1. No evidence of DVT within right lower extremity.
2. An approximately 2.6 cm structure appears to correlate with the
patient's palpable area of concern posterior to the right knee - as
this structure remains indeterminate on this examination, further
evaluation with MRI could be performed as clinically indicated.

## 2019-12-12 ENCOUNTER — Emergency Department
Admission: EM | Admit: 2019-12-12 | Discharge: 2019-12-12 | Disposition: A | Discharge status: Home / Self Care | Payer: Managed Care, Other (non HMO) | Attending: Emergency Medicine | Admitting: Emergency Medicine

## 2019-12-12 ENCOUNTER — Encounter: Payer: Self-pay | Admitting: Emergency Medicine

## 2019-12-12 ENCOUNTER — Other Ambulatory Visit: Payer: Self-pay

## 2019-12-12 DIAGNOSIS — Z79899 Other long term (current) drug therapy: Secondary | ICD-10-CM | POA: Insufficient documentation

## 2019-12-12 DIAGNOSIS — H9201 Otalgia, right ear: Secondary | ICD-10-CM | POA: Insufficient documentation

## 2019-12-12 DIAGNOSIS — Z760 Encounter for issue of repeat prescription: Secondary | ICD-10-CM | POA: Insufficient documentation

## 2019-12-12 DIAGNOSIS — I10 Essential (primary) hypertension: Secondary | ICD-10-CM | POA: Insufficient documentation

## 2019-12-12 MED ORDER — AMLODIPINE BESYLATE 5 MG PO TABS: 5.0000 mg | ORAL_TABLET | Freq: Every day | ORAL | 0 refills | Status: AC

## 2019-12-12 MED ORDER — AMOXICILLIN 500 MG PO TABS: 500.0000 mg | ORAL_TABLET | Freq: Three times a day (TID) | ORAL | 0 refills | Status: AC

## 2019-12-12 MED ORDER — AMLODIPINE BESYLATE 5 MG PO TABS
5.0000 mg | ORAL_TABLET | Freq: Once | ORAL | Status: AC
  Administered 2019-12-12: 5 mg via ORAL
  Filled 2019-12-12: qty 1

## 2019-12-12 MED ORDER — AMOXICILLIN 500 MG PO CAPS
500.0000 mg | ORAL_CAPSULE | Freq: Once | ORAL | Status: AC
  Administered 2019-12-12: 500 mg via ORAL
  Filled 2019-12-12: qty 1

## 2019-12-12 NOTE — ED Triage Notes (Signed)
Pt presents to ED with c/o R ear pain x 2 days. Pt also states out of his HTN meds, states Amlodipine 5mg  for HTN, states ran out yesterday and is unable to get appt with PCP for 2 weeks.

## 2019-12-12 NOTE — Discharge Instructions (Signed)
Please follow up with your dentist as scheduled.  Also, see primary care for refills of your amlodipine.

## 2019-12-12 NOTE — ED Provider Notes (Signed)
[facility] Emergency Department Provider Note ____________________________________________  Time seen: Approximately 6:38 PM  I have reviewed the triage vital signs and the nursing notes.   HISTORY  Chief Complaint Otalgia and Medication Refill    HPI Luis Reynolds is a 39 y.o. male with a history of hypertension presents to the ER for refill of amlodipine and evaluation of right otalgia. He has been out of medication for 2 days. Pain in his ear had gotten better with some OTC drops and also while on antibiotic for dental infection. He has finished the antibiotic. He's not sure if the pain is related to dental infection or ear infection. No fever. No alleviating measures.   Past Medical History:  Diagnosis Date  . Hypertension     There are no problems to display for this patient.   History reviewed. No pertinent surgical history.  Prior to Admission medications   Medication Sig Start Date End Date Taking? Authorizing Provider  amLODipine (NORVASC) 5 MG tablet Take 1 tablet (5 mg total) by mouth daily. 12/12/19 12/11/20  Dakota Stangl, Dessa Phi, FNP  amoxicillin (AMOXIL) 500 MG tablet Take 1 tablet (500 mg total) by mouth 3 (three) times daily. 12/12/19   Jennings Corado B, FNP  hydrochlorothiazide (MICROZIDE) 12.5 MG capsule Take 1 capsule (12.5 mg total) by mouth daily. 08/29/18   Victorino Dike, FNP    Allergies Patient has no known allergies.  History reviewed. No pertinent family history.  Social History Social History   Tobacco Use  . Smoking status: Never Smoker  . Smokeless tobacco: Never Used  Substance Use Topics  . Alcohol use: Never  . Drug use: Never    Review of Systems Constitutional: Negative for fever. Negative for decreased ability to hear from either ear(s). Eyes: Negative for discharge or drainage. ENT:       Positive for otalgia in right ear(s).      Negative for rhinorrhea or congestion.      Negative for sore  throat. Gastrointestinal: Negative for nausea, vomiting, or diarrhea. Musculoskeletal: Negative for myalgias. Skin: Negative for rash, lesions, or wounds. Neurological: Negative for paresthesias. ____________________________________________   PHYSICAL EXAM:  VITAL SIGNS: ED Triage Vitals  Enc Vitals Group     BP 12/12/19 1819 (!) 164/89     Pulse Rate 12/12/19 1819 95     Resp 12/12/19 1819 20     Temp 12/12/19 1819 98.3 F (36.8 C)     Temp Source 12/12/19 1819 Oral     SpO2 12/12/19 1819 98 %     Weight 12/12/19 1820 270 lb (122.5 kg)     Height 12/12/19 1820 5\' 7"  (1.702 m)     Head Circumference --      Peak Flow --      Pain Score 12/12/19 1826 8     Pain Loc --      Pain Edu? --      Excl. in [place]? --     Constitutional: Well appearing. Eyes: Conjunctivae are clear without discharge or drainage. Ears:       Right TM: Injected, mildly erythematous.      Left TM: mildly erythematous. Head: Atraumatic. Nose: No rhinorrhea or sinus pain on percussion. Mouth/Throat: Oropharynx normal. Tonsils normal without exudate. Hematological/Lymphatic/Immunilogical: No palpable anterior cervical lymphadenopathy. Cardiovascular: Heart rate and rhythm are regular without murmur, gallop, or rub appreciated. Respiratory: Breath sounds are clear throughout to auscultation.  Neurologic:  Alert and oriented x 4. Skin: Intact and without rash,  lesion, or wound on exposed skin surfaces. ____________________________________________   LABS (all labs ordered are listed, but only abnormal results are displayed)  Labs Reviewed - No data to display ____________________________________________   RADIOLOGY  Not indicated ____________________________________________   PROCEDURES  Procedure(s) performed:   Procedures  ____________________________________________   INITIAL IMPRESSION / ASSESSMENT AND PLAN / ED COURSE  39 year old male presents to the emergency department for  treatment and evaluation of otalgia and refill of hypertension medication. He has an appointment with his dentist next week to have a tooth pulled. No obvious dental abscess on exam today and no otitis media. He will be covered with amoxicillin to ensure his dentist will be able to pull the tooth as scheduled. This will also cover if he is developing an ear infection. Amlodipine prescription written as well. Due to the pharmacies all being closed, dose of each provided before discharge. He is to see primary care and his dentist. He is to return to the ER for symptoms of concern if unable to do so.  Pertinent labs & imaging results that were available during my care of the patient were reviewed by me and considered in my medical decision making (see chart for details). ____________________________________________   FINAL CLINICAL IMPRESSION(S) / ED DIAGNOSES  Final diagnoses:  Medication refill  Otalgia of right ear    ED Discharge Orders         Ordered    amLODipine (NORVASC) 5 MG tablet  Daily     12/12/19 1900    amoxicillin (AMOXIL) 500 MG tablet  3 times daily     12/12/19 1900          If controlled substance prescribed during this visit, 12 month history viewed on the [facility] prior to issuing an initial prescription for Schedule II or III opiod.   Note:  This document was prepared using Dragon voice recognition software and may include unintentional dictation errors.    Chinita Pester, FNP 12/12/19 1943    Minna Antis, MD 12/12/19 2044

## 2022-12-24 ENCOUNTER — Emergency Department
Admission: EM | Admit: 2022-12-24 | Discharge: 2022-12-24 | Discharge status: Against Medical Advice | Payer: Self-pay | Attending: Emergency Medicine | Admitting: Emergency Medicine

## 2022-12-24 ENCOUNTER — Encounter: Payer: Self-pay | Admitting: Intensive Care

## 2022-12-24 ENCOUNTER — Emergency Department: Payer: Self-pay

## 2022-12-24 ENCOUNTER — Other Ambulatory Visit: Payer: Self-pay

## 2022-12-24 DIAGNOSIS — R0789 Other chest pain: Secondary | ICD-10-CM | POA: Insufficient documentation

## 2022-12-24 DIAGNOSIS — Z5321 Procedure and treatment not carried out due to patient leaving prior to being seen by health care provider: Secondary | ICD-10-CM | POA: Insufficient documentation

## 2022-12-24 LAB — BASIC METABOLIC PANEL
Anion gap: 11 (ref 5–15)
BUN: 11 mg/dL (ref 6–20)
CO2: 23 mmol/L (ref 22–32)
Calcium: 9.2 mg/dL (ref 8.9–10.3)
Chloride: 102 mmol/L (ref 98–111)
Creatinine, Ser: 1.06 mg/dL (ref 0.61–1.24)
GFR, Estimated: >60 mL/min (ref >60)
Glucose, Bld: 112 mg/dL — ABNORMAL HIGH (ref 70–99)
Potassium: 3.2 mmol/L — ABNORMAL LOW (ref 3.5–5.1)
Sodium: 136 mmol/L (ref 135–145)

## 2022-12-24 LAB — TROPONIN I (HIGH SENSITIVITY): Troponin I (High Sensitivity): 6 ng/L (ref <18)

## 2022-12-24 LAB — CBC
HCT: 42.1 % (ref 39.0–52.0)
Hemoglobin: 14.1 g/dL (ref 13.0–17.0)
MCH: 31.1 pg (ref 26.0–34.0)
MCHC: 33.5 g/dL (ref 30.0–36.0)
MCV: 92.9 fL (ref 80.0–100.0)
Platelets: 211 10*3/uL (ref 150–400)
RBC: 4.53 MIL/uL (ref 4.22–5.81)
RDW: 13.8 % (ref 11.5–15.5)
WBC: 6.4 10*3/uL (ref 4.0–10.5)
nRBC: 0 % (ref 0.0–0.2)

## 2022-12-24 NOTE — ED Triage Notes (Signed)
Patient c/o chest tightness that started earlier today and intermittent. Reports now it is constant.

## 2022-12-24 NOTE — ED Notes (Signed)
No answer when called several times from lobby
